# Patient Record
Sex: Female | Born: 1935 | Race: White | Hispanic: No | Marital: Married | Smoking: Never smoker
Health system: Southern US, Community
[De-identification: ages and names within clinical notes are randomized; demographics above are authoritative.]

## PROBLEM LIST (undated history)

## (undated) DIAGNOSIS — F329 Major depressive disorder, single episode, unspecified: Secondary | ICD-10-CM

## (undated) DIAGNOSIS — K219 Gastro-esophageal reflux disease without esophagitis: Secondary | ICD-10-CM

## (undated) DIAGNOSIS — I208 Other forms of angina pectoris: Secondary | ICD-10-CM

## (undated) DIAGNOSIS — I10 Essential (primary) hypertension: Secondary | ICD-10-CM

## (undated) DIAGNOSIS — F419 Anxiety disorder, unspecified: Secondary | ICD-10-CM

## (undated) DIAGNOSIS — F32A Depression, unspecified: Secondary | ICD-10-CM

## (undated) DIAGNOSIS — M1990 Unspecified osteoarthritis, unspecified site: Secondary | ICD-10-CM

## (undated) HISTORY — PX: CHOLECYSTECTOMY: SHX55

## (undated) HISTORY — PX: APPENDECTOMY: SHX54

## (undated) HISTORY — PX: JOINT REPLACEMENT: SHX530

## (undated) HISTORY — PX: THYROID SURGERY: SHX805

## (undated) HISTORY — PX: ABDOMINAL HYSTERECTOMY: SHX81

---
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
## 2019-11-13 ENCOUNTER — Other Ambulatory Visit: Payer: Self-pay | Admitting: Orthopedic Surgery

## 2019-11-14 MED ORDER — CEFAZOLIN SODIUM-DEXTROSE 1-4 GM/50ML-% IV SOLN
1.0000 g | INTRAVENOUS | Status: AC
  Administered 2019-11-15: 2 g via INTRAVENOUS

## 2019-11-15 ENCOUNTER — Other Ambulatory Visit: Payer: Self-pay

## 2019-11-15 ENCOUNTER — Encounter: Payer: Self-pay | Admitting: Orthopedic Surgery

## 2019-11-15 ENCOUNTER — Encounter
Admission: RE | Disposition: A | Discharge status: Home / Self Care | Payer: Self-pay | Source: Home / Self Care | Attending: Orthopedic Surgery

## 2019-11-15 ENCOUNTER — Ambulatory Visit: Payer: Medicare Other | Admitting: Anesthesiology

## 2019-11-15 ENCOUNTER — Ambulatory Visit: Payer: Medicare Other

## 2019-11-15 ENCOUNTER — Ambulatory Visit
Admission: RE | Admit: 2019-11-15 | Discharge: 2019-11-15 | Disposition: A | Discharge status: Home / Self Care | Payer: Medicare Other | Attending: Orthopedic Surgery | Admitting: Orthopedic Surgery

## 2019-11-15 DIAGNOSIS — K219 Gastro-esophageal reflux disease without esophagitis: Secondary | ICD-10-CM | POA: Diagnosis not present

## 2019-11-15 DIAGNOSIS — X58XXXA Exposure to other specified factors, initial encounter: Secondary | ICD-10-CM | POA: Insufficient documentation

## 2019-11-15 DIAGNOSIS — Z20822 Contact with and (suspected) exposure to covid-19: Secondary | ICD-10-CM | POA: Diagnosis not present

## 2019-11-15 DIAGNOSIS — I10 Essential (primary) hypertension: Secondary | ICD-10-CM | POA: Diagnosis not present

## 2019-11-15 DIAGNOSIS — F419 Anxiety disorder, unspecified: Secondary | ICD-10-CM | POA: Diagnosis not present

## 2019-11-15 DIAGNOSIS — S3210XA Unspecified fracture of sacrum, initial encounter for closed fracture: Secondary | ICD-10-CM | POA: Insufficient documentation

## 2019-11-15 DIAGNOSIS — Z79899 Other long term (current) drug therapy: Secondary | ICD-10-CM | POA: Diagnosis not present

## 2019-11-15 DIAGNOSIS — F329 Major depressive disorder, single episode, unspecified: Secondary | ICD-10-CM | POA: Diagnosis not present

## 2019-11-15 DIAGNOSIS — T148XXA Other injury of unspecified body region, initial encounter: Secondary | ICD-10-CM

## 2019-11-15 DIAGNOSIS — M1990 Unspecified osteoarthritis, unspecified site: Secondary | ICD-10-CM | POA: Insufficient documentation

## 2019-11-15 DIAGNOSIS — Z96651 Presence of right artificial knee joint: Secondary | ICD-10-CM | POA: Insufficient documentation

## 2019-11-15 HISTORY — DX: Anxiety disorder, unspecified: F41.9

## 2019-11-15 HISTORY — DX: Other forms of angina pectoris: I20.8

## 2019-11-15 HISTORY — DX: Gastro-esophageal reflux disease without esophagitis: K21.9

## 2019-11-15 HISTORY — DX: Essential (primary) hypertension: I10

## 2019-11-15 HISTORY — DX: Depression, unspecified: F32.A

## 2019-11-15 HISTORY — PX: SACROPLASTY: SHX6797

## 2019-11-15 HISTORY — DX: Unspecified osteoarthritis, unspecified site: M19.90

## 2019-11-15 LAB — RESPIRATORY PANEL BY RT PCR (FLU A&B, COVID)
Influenza A by PCR: NEGATIVE
Influenza B by PCR: NEGATIVE
SARS Coronavirus 2 by RT PCR: NEGATIVE

## 2019-11-15 SURGERY — SACROPLASTY
Anesthesia: General | Site: Back

## 2019-11-15 MED ORDER — BUPIVACAINE-EPINEPHRINE (PF) 0.5% -1:200000 IJ SOLN
INTRAMUSCULAR | Status: DC | PRN
  Administered 2019-11-15: 20 mL via PERINEURAL

## 2019-11-15 MED ORDER — BUPIVACAINE-EPINEPHRINE (PF) 0.5% -1:200000 IJ SOLN
INTRAMUSCULAR | Status: AC
  Filled 2019-11-15: qty 30

## 2019-11-15 MED ORDER — LIDOCAINE HCL (PF) 1 % IJ SOLN
INTRAMUSCULAR | Status: DC | PRN
  Administered 2019-11-15: 10 mL
  Administered 2019-11-15: 20 mL

## 2019-11-15 MED ORDER — BUPIVACAINE HCL (PF) 0.5 % IJ SOLN
INTRAMUSCULAR | Status: AC
  Filled 2019-11-15: qty 30

## 2019-11-15 MED ORDER — PROPOFOL 10 MG/ML IV BOLUS
INTRAVENOUS | Status: DC | PRN
  Administered 2019-11-15: 20 mg via INTRAVENOUS
  Administered 2019-11-15: 30 ug/kg/min via INTRAVENOUS

## 2019-11-15 MED ORDER — PROPOFOL 500 MG/50ML IV EMUL
INTRAVENOUS | Status: AC
  Filled 2019-11-15: qty 50

## 2019-11-15 MED ORDER — CHLORHEXIDINE GLUCONATE 0.12 % MT SOLN: 15.0000 mL | Freq: Once | OROMUCOSAL | Status: DC

## 2019-11-15 MED ORDER — CEFAZOLIN SODIUM-DEXTROSE 1-4 GM/50ML-% IV SOLN
INTRAVENOUS | Status: AC
  Filled 2019-11-15: qty 50

## 2019-11-15 MED ORDER — LIDOCAINE HCL (PF) 1 % IJ SOLN
INTRAMUSCULAR | Status: AC
  Filled 2019-11-15: qty 30

## 2019-11-15 MED ORDER — ORAL CARE MOUTH RINSE: 15.0000 mL | Freq: Once | OROMUCOSAL | Status: DC

## 2019-11-15 MED ORDER — OXYCODONE HCL 5 MG PO TABS: 5.0000 mg | ORAL_TABLET | Freq: Three times a day (TID) | ORAL | 0 refills | Status: AC | PRN

## 2019-11-15 MED ORDER — SODIUM CHLORIDE 0.9 % IV SOLN: INTRAVENOUS | Status: DC

## 2019-11-15 SURGICAL SUPPLY — 20 items
BNDG ADH 1X3 SHEER STRL LF (GAUZE/BANDAGES/DRESSINGS) ×2 IMPLANT
CEMENT KYPHON CX01A KIT/MIXER (Cement) ×2 IMPLANT
COVER WAND RF STERILE (DRAPES) ×3 IMPLANT
DERMABOND ADVANCED (GAUZE/BANDAGES/DRESSINGS) ×2
DERMABOND ADVANCED .7 DNX12 (GAUZE/BANDAGES/DRESSINGS) ×1 IMPLANT
DRAPE C-ARM XRAY 36X54 (DRAPES) ×3 IMPLANT
DURAPREP 26ML APPLICATOR (WOUND CARE) ×3 IMPLANT
GLOVE SURG SYN 9.0  PF PI (GLOVE) ×2
GLOVE SURG SYN 9.0 PF PI (GLOVE) ×1 IMPLANT
GOWN SRG 2XL LVL 4 RGLN SLV (GOWNS) ×1 IMPLANT
GOWN STRL NON-REIN 2XL LVL4 (GOWNS) ×2
GOWN STRL REUS W/ TWL LRG LVL3 (GOWN DISPOSABLE) ×1 IMPLANT
GOWN STRL REUS W/TWL LRG LVL3 (GOWN DISPOSABLE) ×2
KIT OSTEOCOOL BONE ACCESS 10G (MISCELLANEOUS) ×6 IMPLANT
KIT TURNOVER KIT A (KITS) ×3 IMPLANT
PACK KYPHOPLASTY (MISCELLANEOUS) ×3 IMPLANT
SWABSTK COMLB BENZOIN TINCTURE (MISCELLANEOUS) ×3 IMPLANT
SYS CARTRIDGE BONE CEMENT 8ML (SYSTAGENIX WOUND MANAGEMENT) ×6
SYSTEM CARTRIDG BONE CEMNT 8ML (SYSTAGENIX WOUND MANAGEMENT) ×1 IMPLANT
SYSTEM GUN BONE FILLER SZ2 (MISCELLANEOUS) ×3 IMPLANT

## 2019-11-15 NOTE — Anesthesia Procedure Notes (Signed)
Date/Time: 11/15/2019 1:00 PM Performed by: Henrietta Hoover, CRNA Pre-anesthesia Checklist: Patient identified, Emergency Drugs available, Suction available, Patient being monitored and Timeout performed Oxygen Delivery Method: Nasal cannula Placement Confirmation: positive ETCO2

## 2019-11-15 NOTE — H&P (Signed)
Subjective:   Patient is a 86 y.o. female presents with sacral insufficiency fracture.  She lives in [facility] [place] and was referred by Dr. Gerrit Heck in [place].  She has had persisting pain over a month and had MRI that showed sacral insufficiency fractures bilaterally.  She is having intense pain with any weightbearing and it has not been relieved with oral pain medication or bed rest.  She is now admitted as an outpatient for lumbosacral vertebroplasty at the sacral 1 level  There are no problems to display for this patient.  Past Medical History:  Diagnosis Date  . Angina at rest [facility])   . Anxiety   . Arthritis   . Depression   . GERD (gastroesophageal reflux disease)   . Hypertension     Past Surgical History:  Procedure Laterality Date  . ABDOMINAL HYSTERECTOMY    . APPENDECTOMY    . CHOLECYSTECTOMY    . JOINT REPLACEMENT     right knee   . THYROID SURGERY      Medications Prior to Admission  Medication Sig Dispense Refill Last Dose  . amLODipine (NORVASC) 5 MG tablet Take 5 mg by mouth daily.   11/14/2019 at Unknown time  . Ascorbic Acid (VITAMIN C) 1000 MG tablet Take 1,000 mg by mouth daily.   11/14/2019 at Unknown time  . atorvastatin (LIPITOR) 20 MG tablet Take 20 mg by mouth daily.   11/14/2019 at Unknown time  . Cholecalciferol (VITAMIN D3) 125 MCG (5000 UT) TABS Take 1 tablet by mouth daily.   11/14/2019 at Unknown time  . DULoxetine (CYMBALTA) 30 MG capsule Take 30 mg by mouth daily.   11/14/2019 at Unknown time  . escitalopram (LEXAPRO) 10 MG tablet Take 10 mg by mouth daily.   11/14/2019 at Unknown time  . isosorbide dinitrate (ISORDIL) 30 MG tablet Take 30 mg by mouth daily.   11/14/2019 at Unknown time  . lisinopril (ZESTRIL) 40 MG tablet Take 40 mg by mouth daily.   11/14/2019 at Unknown time  . pantoprazole (PROTONIX) 40 MG tablet Take 40 mg by mouth 2 (two) times daily.   11/14/2019 at Unknown time  . traZODone (DESYREL) 50 MG tablet Take 50 mg by mouth at bedtime.    11/14/2019 at Unknown time  . vitamin E (VITAMIN E) 180 MG (400 UNITS) capsule Take 400 Units by mouth daily.   11/14/2019 at Unknown time   No Known Allergies  Social History   Tobacco Use  . Smoking status: Never Smoker  . Smokeless tobacco: Never Used  Substance Use Topics  . Alcohol use: Not Currently    History reviewed. No pertinent family history.  Review of Systems Pertinent items are noted in HPI.  Objective:   Patient Vitals for the past 8 hrs:  BP Temp Temp src Pulse Resp SpO2 Height Weight  11/15/19 1050 (!) 170/49 98.3 F (36.8 C) Oral 62 14 98 % 5\' 6"  (1.676 m) 63.5 kg   No intake/output data recorded. No intake/output data recorded.    BP (!) 170/49   Pulse 62   Temp 98.3 F (36.8 C) (Oral)   Resp 14   Ht 5\' 6"  (1.676 m)   Wt 63.5 kg   SpO2 98%   BMI 22.60 kg/m  General appearance: alert, cooperative, appears stated age and mild distress Lungs: clear to auscultation bilaterally Heart: regular rate and rhythm, S1, S2 normal, no murmur, click, rub or gallop  Data ReviewRadiology review: Prior MRI from [place] shows  sacral insufficiency fractures  Assessment:   Sacral insufficiency fractures with failed nonoperative measures  Plan:   Lumbosacral vertebroplasty at the S1 level bilateral

## 2019-11-15 NOTE — Anesthesia Postprocedure Evaluation (Signed)
Anesthesia Post Note  Patient: Sandra Williamson  Procedure(s) Performed: SACROPLASTY (N/A Back)  Patient location during evaluation: PACU Anesthesia Type: General Level of consciousness: awake and alert Pain management: pain level controlled Vital Signs Assessment: post-procedure vital signs reviewed and stable Respiratory status: spontaneous breathing and respiratory function stable Cardiovascular status: stable Anesthetic complications: no     Last Vitals:  Vitals:   11/15/19 1518 11/15/19 1525  BP: (!) 184/83 (!) 167/87  Pulse:    Resp:    Temp:    SpO2:      Last Pain:  Vitals:   11/15/19 1513  TempSrc: Temporal  PainSc: 0-No pain                 Aubriegh Minch K

## 2019-11-15 NOTE — Anesthesia Preprocedure Evaluation (Signed)
Anesthesia Evaluation  Patient identified by MRN, date of birth, ID band Patient awake    Reviewed: Allergy & Precautions, NPO status , Patient's Chart, lab work & pertinent test results  History of Anesthesia Complications Negative for: history of anesthetic complications  Airway Mallampati: III       Dental  (+) Edentulous Upper, Edentulous Lower   Pulmonary shortness of breath, neg sleep apnea, neg COPD,  oxygen dependent, Not current smoker,           Cardiovascular hypertension, Pt. on medications (-) Past MI and (-) CHF (-) dysrhythmias (-) Valvular Problems/Murmurs     Neuro/Psych neg Seizures Anxiety Depression    GI/Hepatic Neg liver ROS, GERD  Medicated and Controlled,  Endo/Other  neg diabetes  Renal/GU negative Renal ROS     Musculoskeletal   Abdominal   Peds  Hematology   Anesthesia Other Findings   Reproductive/Obstetrics                             Anesthesia Physical Anesthesia Plan  ASA: III  Anesthesia Plan: General   Post-op Pain Management:    Induction:   PONV Risk Score and Plan: 3 and Propofol infusion, TIVA and Treatment may vary due to age or medical condition  Airway Management Planned: Nasal Cannula  Additional Equipment:   Intra-op Plan:   Post-operative Plan:   Informed Consent: I have reviewed the patients History and Physical, chart, labs and discussed the procedure including the risks, benefits and alternatives for the proposed anesthesia with the patient or authorized representative who has indicated his/her understanding and acceptance.       Plan Discussed with:   Anesthesia Plan Comments:         Anesthesia Quick Evaluation

## 2019-11-15 NOTE — Op Note (Signed)
11/15/2019  1:39 PM  PATIENT:  Sandra Williamson  86 y.o. female  PRE-OPERATIVE DIAGNOSIS:  Unspecified fracture of sacrum, initial encounter for closed fracture  POST-OPERATIVE DIAGNOSIS:  Unspecified fracture of sacrum, initial encounter for closed fracture  PROCEDURE:  Procedure(s): SACROPLASTY (N/A)  SURGEON: Laurene Footman, MD  ASSISTANTS: None  ANESTHESIA:   local and MAC  EBL:  Total I/O In: 400 [I.V.:400] Out: -   BLOOD ADMINISTERED:none  DRAINS: none   LOCAL MEDICATIONS USED:  MARCAINE    and XYLOCAINE   SPECIMEN:  No Specimen  DISPOSITION OF SPECIMEN:  N/A  COUNTS:  YES  TOURNIQUET:  * No tourniquets in log *  IMPLANTS: Bone cement  DICTATION: .Dragon Dictation patient was brought to the operating room and after adequate sedation was given patient was placed prone on the operative table.  C arm was brought in and good visualization and AP lateral projections of the sacrum and SI joints was obtained.  After patient identification and timeout procedures 10 cc of 1% Xylocaine was infiltrated in subcutaneous tissue near the planned incisions on either side of the sacrum.  The low back was then prepped and draped in the usual sterile fashion and repeat timeout procedure carried out.  A spinal needle was placed down to the sacrum with a 50-50 mix of 1% Xylocaine half percent Sensorcaine with epinephrine 20 cc to each side.  After allowing this to set a small incision was made on the left first on the right with trocar advanced into the S1 between the SI joint and the sacral foramina.  Cement was mixed and approximately 4 cc on the left 3 cc on the right was infiltrated on the right side there appeared to be some extravasation through a fracture line anteriorly at the level of the distal SI joint and possibly and across the SI joint.  When the cemented set trochars were removed and there appeared to be good fill of S1 and S2 levels.  The wounds were covered with Dermabond  and then Band-Aids  PLAN OF CARE: Discharge to home after PACU  PATIENT DISPOSITION:  PACU - hemodynamically stable.

## 2019-11-15 NOTE — Transfer of Care (Signed)
Immediate Anesthesia Transfer of Care Note  Patient: Sandra Williamson  Procedure(s) Performed: SACROPLASTY (N/A Back)  Patient Location: PACU  Anesthesia Type:MAC  Level of Consciousness: drowsy  Airway & Oxygen Therapy: Patient Spontanous Breathing and Patient connected to nasal cannula oxygen  Post-op Assessment: Report given to RN and Post -op Vital signs reviewed and stable  Post vital signs: Reviewed and stable  Last Vitals:  Vitals Value Taken Time  BP 163/81 11/15/19 1343  Temp 35.7 C 11/15/19 1343  Pulse 62 11/15/19 1345  Resp 18 11/15/19 1345  SpO2 98 % 11/15/19 1345  Vitals shown include unvalidated device data.  Last Pain:  Vitals:   11/15/19 1343  TempSrc:   PainSc: Asleep         Complications: No apparent anesthesia complications

## 2019-11-15 NOTE — Discharge Instructions (Addendum)
Take it easy today and tomorrow.  Try to resume normal activities on Sunday.  Remove Band-Aids on Sunday then okay to shower.  Do not take a bath for 10 days to let incisions completely healed.  Pain medicine as directed.  AMBULATORY SURGERY  DISCHARGE INSTRUCTIONS   1) The drugs that you were given will stay in your system until tomorrow so for the next 24 hours you should not:  A) Drive an automobile B) Make any legal decisions C) Drink any alcoholic beverage   2) You may resume regular meals tomorrow.  Today it is better to start with liquids and gradually work up to solid foods.  You may eat anything you prefer, but it is better to start with liquids, then soup and crackers, and gradually work up to solid foods.   3) Please notify your doctor immediately if you have any unusual bleeding, trouble breathing, redness and pain at the surgery site, drainage, fever, or pain not relieved by medication.    4) Additional Instructions:        Please contact your physician with any problems or Same Day Surgery at [phone], Monday through Friday 6 am to 4 pm, or  at [facility] number at [phone].

## 2021-04-26 IMAGING — XA DG LUMBAR SPINE 2-3V
1 series · 1 of 1 positions shown · IV contrast (agent unspecified)
Comparison: No prior.

CLINICAL DATA: Sacroplasty.

EXAM:
DG C-ARM 1-60 MIN; LUMBAR SPINE - 2-3 VIEW
CONTRAST:  None.
FLUOROSCOPY TIME:  Fluoroscopy Time:  2 minutes 10 seconds
Number of Acquired Spot Images: 1

[Series 2: ortho standard · 1 of 1 slices shown]
[im 1/1]
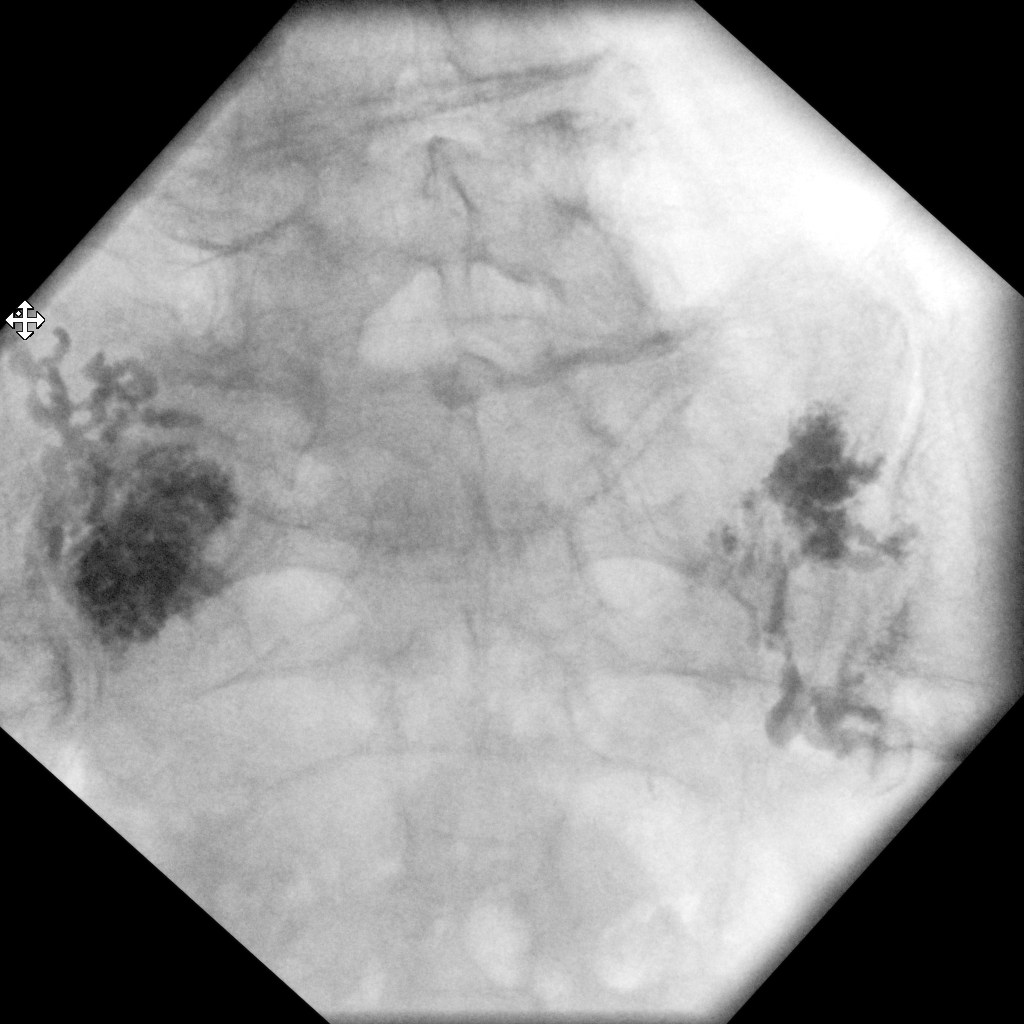

[1 of 1 positions shown; findings below may reference images not displayed]

FINDINGS: Postsurgical changes consistent bilateral sacroplasty.
IMPRESSION: Bilateral sacroplasty.

## 2022-10-10 DEATH — deceased
